# Patient Record
Sex: Male | Born: 1965 | Race: White | Hispanic: No | Marital: Single | Smoking: Current every day smoker
Health system: Southern US, Community
[De-identification: ages and names within clinical notes are randomized; demographics above are authoritative.]

---

## 2015-09-30 ENCOUNTER — Encounter: Payer: Self-pay | Admitting: *Deleted

## 2015-09-30 ENCOUNTER — Emergency Department: Payer: Self-pay

## 2015-09-30 ENCOUNTER — Emergency Department
Admission: EM | Admit: 2015-09-30 | Discharge: 2015-09-30 | Disposition: A | Discharge status: Home / Self Care | Payer: Self-pay | Attending: Emergency Medicine | Admitting: Emergency Medicine

## 2015-09-30 DIAGNOSIS — F17210 Nicotine dependence, cigarettes, uncomplicated: Secondary | ICD-10-CM | POA: Insufficient documentation

## 2015-09-30 DIAGNOSIS — Y9389 Activity, other specified: Secondary | ICD-10-CM | POA: Insufficient documentation

## 2015-09-30 DIAGNOSIS — S4382XA Sprain of other specified parts of left shoulder girdle, initial encounter: Secondary | ICD-10-CM | POA: Insufficient documentation

## 2015-09-30 DIAGNOSIS — Y998 Other external cause status: Secondary | ICD-10-CM | POA: Insufficient documentation

## 2015-09-30 DIAGNOSIS — S63501A Unspecified sprain of right wrist, initial encounter: Secondary | ICD-10-CM | POA: Insufficient documentation

## 2015-09-30 DIAGNOSIS — S46912A Strain of unspecified muscle, fascia and tendon at shoulder and upper arm level, left arm, initial encounter: Secondary | ICD-10-CM | POA: Insufficient documentation

## 2015-09-30 DIAGNOSIS — Y9289 Other specified places as the place of occurrence of the external cause: Secondary | ICD-10-CM | POA: Insufficient documentation

## 2015-09-30 DIAGNOSIS — R000 Tachycardia, unspecified: Secondary | ICD-10-CM | POA: Insufficient documentation

## 2015-09-30 DIAGNOSIS — W11XXXA Fall on and from ladder, initial encounter: Secondary | ICD-10-CM | POA: Insufficient documentation

## 2015-09-30 DIAGNOSIS — S43102A Unspecified dislocation of left acromioclavicular joint, initial encounter: Secondary | ICD-10-CM | POA: Insufficient documentation

## 2015-09-30 LAB — COMPREHENSIVE METABOLIC PANEL
ALBUMIN: 3.4 g/dL — AB (ref 3.5–5.0)
ALK PHOS: 114 U/L (ref 38–126)
ALT: 9 U/L — ABNORMAL LOW (ref 17–63)
ANION GAP: 8 (ref 5–15)
AST: 15 U/L (ref 15–41)
BILIRUBIN TOTAL: 0.4 mg/dL (ref 0.3–1.2)
BUN: 9 mg/dL (ref 6–20)
CO2: 22 mmol/L (ref 22–32)
Calcium: 8.4 mg/dL — ABNORMAL LOW (ref 8.9–10.3)
Chloride: 104 mmol/L (ref 101–111)
Creatinine, Ser: 0.95 mg/dL (ref 0.61–1.24)
GFR calc Af Amer: >60 mL/min (ref >60)
GFR calc non Af Amer: >60 mL/min (ref >60)
GLUCOSE: 87 mg/dL (ref 65–99)
POTASSIUM: 4.2 mmol/L (ref 3.5–5.1)
SODIUM: 134 mmol/L — AB (ref 135–145)
TOTAL PROTEIN: 6.9 g/dL (ref 6.5–8.1)

## 2015-09-30 LAB — CBC
HCT: 29 % — ABNORMAL LOW (ref 40.0–52.0)
HEMOGLOBIN: 9.1 g/dL — AB (ref 13.0–18.0)
MCH: 22.2 pg — AB (ref 26.0–34.0)
MCHC: 31.4 g/dL — AB (ref 32.0–36.0)
MCV: 70.7 fL — AB (ref 80.0–100.0)
Platelets: 120 10*3/uL — ABNORMAL LOW (ref 150–440)
RBC: 4.1 MIL/uL — AB (ref 4.40–5.90)
RDW: 34.1 % — ABNORMAL HIGH (ref 11.5–14.5)
WBC: 7.8 10*3/uL (ref 3.8–10.6)

## 2015-09-30 LAB — TROPONIN I: Troponin I: <0.03 ng/mL (ref <0.031)

## 2015-09-30 MED ORDER — NAPROXEN 500 MG PO TABS: 500.0000 mg | ORAL_TABLET | Freq: Two times a day (BID) | ORAL | Status: AC

## 2015-09-30 MED ORDER — HYDROCODONE-ACETAMINOPHEN 5-325 MG PO TABS: 1.0000 | ORAL_TABLET | ORAL | Status: DC | PRN

## 2015-09-30 MED ORDER — MORPHINE SULFATE (PF) 4 MG/ML IV SOLN
4.0000 mg | Freq: Once | INTRAVENOUS | Status: AC
  Administered 2015-09-30: 4 mg via INTRAVENOUS
  Filled 2015-09-30: qty 1

## 2015-09-30 MED ORDER — OXYCODONE-ACETAMINOPHEN 5-325 MG PO TABS: 1.0000 | ORAL_TABLET | Freq: Four times a day (QID) | ORAL | Status: AC | PRN

## 2015-09-30 MED ORDER — ONDANSETRON HCL 4 MG/2ML IJ SOLN
4.0000 mg | Freq: Once | INTRAMUSCULAR | Status: AC
  Administered 2015-09-30: 4 mg via INTRAVENOUS
  Filled 2015-09-30: qty 2

## 2015-09-30 NOTE — Discharge Instructions (Signed)
Shoulder Separation  A shoulder separation (acromioclavicular separation) is an injury to the connecting tissue (ligament) between the top of your shoulder blade (acromion) and your collarbone (clavicle).  The ligament may be stretched, partially torn, or completely torn.   A stretched ligament may not cause very much pain, and it does not move the collarbone out of place. A stretched ligament looks normal on an X-ray.   An injury that is a bit worse may partially tear a ligament and move the collarbone slightly out of place.   A serious injury completely tears both shoulder ligaments. This moves the collarbone severely out of position and changes the way that the shoulder looks (deformity).  CAUSES  The most common cause of a shoulder separation is falling on or receiving a blow to the top of the shoulder. Falling with an outstretched arm may also cause this injury.  RISK FACTORS  You may be at greater risk of a shoulder separation if:   You are male.   You are younger than age 35.   You play a contact sport, such as football or hockey.  SIGNS AND SYMPTOMS  The most common symptom of a shoulder separation is pain on the top of the shoulder after falling on it or receiving a blow to it. Other signs and symptoms include:   Shoulder deformity.   Swelling of the shoulder.   Decreased ability to move the shoulder.   Bruising on top of the shoulder.  DIAGNOSIS  Your health care provider may suspect a shoulder separation based on your symptoms and the details of a recent injury. A physical exam will be done. During this exam, the health care provider may:   Press on your shoulder.   Test the movement of your shoulder.   Ask you to hold a weight in your hand to see if the separation increases.   Do an X-ray.  TREATMENT   A stretch injury may require only a sling, pain medicine, and cold packs. This treatment may last for 2-12 weeks. You may also have physical therapy. A physical therapist will teach you to  do daily exercises to strengthen your shoulder muscles and prevent stiffness.   A complete tear may require surgery to repair the torn ligament. After surgery, you will also require a sling, pain medicine, and cold packs. Recovery may take longer. You may also need more physical therapy.  HOME CARE INSTRUCTIONS   Take medicines only as directed by your health care provider.   Apply ice to the top of your shoulder:   Put ice in a plastic bag.   Place a towel between your skin and the bag.   Leave the ice on for 20 minutes, 2-3 times a day.   Wear your sling or splint as directed by your health care provider.   You may be able to remove your sling to do your physical therapy exercises.   Ask your health care provider when you can stop wearing the sling.   Do not do any activities that make your pain worse.   Do not lift anything that is heavier than 10 lb (4.5 kg) on the injured side of your body.   Ask your health care provider when you can return to athletic activities.  SEEK MEDICAL CARE IF:   Your pain medicine is not relieving your pain.   Your pain and stiffness are not improving after 2 weeks.   You are unable to do your physical therapy exercises because   of pain or stiffness.     This information is not intended to replace advice given to you by your health care provider. Make sure you discuss any questions you have with your health care provider.     Document Released: 05/30/2005 Document Revised: 09/10/2014 Document Reviewed: 01/20/2014  [employer] Interactive Patient Education 2016 [employer]

## 2015-09-30 NOTE — ED Notes (Signed)
Pt was on a ladder cleaning gutters, pt fell landed on arms, bilateral arms are swollen and painful, positive pulses, pt denies hitting head, pt refuses C-collar to be placed on by RN

## 2015-09-30 NOTE — ED Notes (Signed)
Pt states he has a ride with family.

## 2015-09-30 NOTE — ED Provider Notes (Signed)
[facility] Emergency Department Provider Note  ____________________________________________  Time seen: On arrival  I have reviewed the triage vital signs and the nursing notes.   HISTORY  Chief Complaint Fall and Arm Injury    HPI Ronald Boyle is a 49 y.o. male who presents after falling from a ladder. He reports he was cleaning gutters and the ladder went to the right and he fell to the left. He landed on his hands and arms and left chest. He landed on grass. He estimates he fell about 10 feet. He denies abdominal pain. He denies chest pain. He complains of left shoulder pain, right wrist pain. He denies leg pain or pelvic pain. No nausea or vomiting. No shortness of breath.     History reviewed. No pertinent past medical history.  There are no active problems to display for this patient.   History reviewed. No pertinent past surgical history.  No current outpatient prescriptions on file.  Allergies Review of patient's allergies indicates no known allergies.  No family history on file.  Social History Social History  Substance Use Topics  . Smoking status: Current Every Day Smoker -- 1.00 packs/day    Types: Cigarettes  . Smokeless tobacco: None  . Alcohol Use: No    Review of Systems  Constitutional: Negative for dizziness Eyes: Negative for visual changes. ENT: Negative for sore throat, negative for neck pain Cardiovascular: Negative for chest pain. Respiratory: Negative for shortness of breath.  Gastrointestinal: Negative for abdominal pain, vomiting and diarrhea. Genitourinary: Negative for dysuria. Musculoskeletal: Negative for back pain. Positive for left shoulder pain and right wrist pain Skin: Negative for laceration or abrasion Neurological: Negative for headaches or focal weakness Psychiatric: Positive anxiety    ____________________________________________   PHYSICAL EXAM:  VITAL SIGNS: ED Triage Vitals  Enc  Vitals Group     BP 09/30/15 1409 128/89 mmHg     Pulse Rate 09/30/15 1409 112     Resp 09/30/15 1409 22     Temp 09/30/15 1409 98.6 F (37 C)     Temp Source 09/30/15 1409 Oral     SpO2 09/30/15 1409 96 %     Weight 09/30/15 1409 280 lb (127.007 kg)     Height 09/30/15 1409  (1.88 m)     Head Cir --      Peak Flow --      Pain Score 09/30/15 1402 9     Pain Loc --      Pain Edu? --      Excl. in GC? --      Constitutional: Alert and oriented. Well appearing but in pain Eyes: Conjunctivae are normal.  ENT   Head: Normocephalic and atraumatic.   Mouth/Throat: Mucous membranes are moist. Cardiovascular: Tachycardia, regular rhythm. Normal and symmetric distal pulses are present in all extremities. No murmurs, rubs, or gallops. Respiratory: Normal respiratory effort without tachypnea nor retractions. Breath sounds are clear and equal bilaterally.  Gastrointestinal: Soft and non-tender in all quadrants. No distention. There is no CVA tenderness. Genitourinary: deferred Musculoskeletal: Painful range of motion of the left shoulder especially with abduction. No bony abnormalities felt. No tenderness of the clavicle bilaterally. No chest wall tenderness to palpation .No pelvic tenderness to palpation, full range of motion of lower extremities without discomfort. No lower extremity edema. Neurologic:  Normal speech and language. No gross focal neurologic deficits are appreciated. Skin:  Skin is warm, dry and intact. No rash noted. Psychiatric: Mood and affect are normal.  Patient exhibits appropriate insight and judgment.  ____________________________________________    LABS (pertinent positives/negatives)  Labs Reviewed  COMPREHENSIVE METABOLIC PANEL  TROPONIN I  CBC    ____________________________________________   EKG  None  ____________________________________________    RADIOLOGY I have personally reviewed any xrays that were ordered on this patient: no  pneumothorax on chest x-ray Right wrist negative for fracture Left shoulder with possible AC injury  ____________________________________________   PROCEDURES  Procedure(s) performed: none  Critical Care performed: none  ____________________________________________   INITIAL IMPRESSION / ASSESSMENT AND PLAN / ED COURSE  Pertinent labs & imaging results that were available during my care of the patient were reviewed by me and considered in my medical decision making (see chart for details).  Patient presents after significant fall from ladder. No shortness of breath, no chest wall pain, no back pain or neck pain, no head injury. Pain is primarily in the left shoulder and right wrist. 4 g of morphine and 4 mg of Zofran given IV  X-rays are reassuring except for likely acromioclavicular and corococlavicular separation  We will provide the patient with analgesics and he will follow up with orthopedics  ____________________________________________   FINAL CLINICAL IMPRESSION(S) / ED DIAGNOSES  Final diagnoses:  AC separation, left, initial encounter  Coracoclavicular (ligament) sprain and strain, left, initial encounter  Wrist sprain, right, initial encounter     Jene Every, MD 09/30/15 2213

## 2016-10-13 IMAGING — CR DG HAND COMPLETE 3+V*R*
1 series · 3 of 3 positions shown · non-contrast
Comparison: None.

CLINICAL DATA: Status post fall.

EXAM:
RIGHT HAND - COMPLETE 3+ VIEW

[Series 1: pa · 0.17mm/px · 3 of 3 slices shown]
[im 1/3]
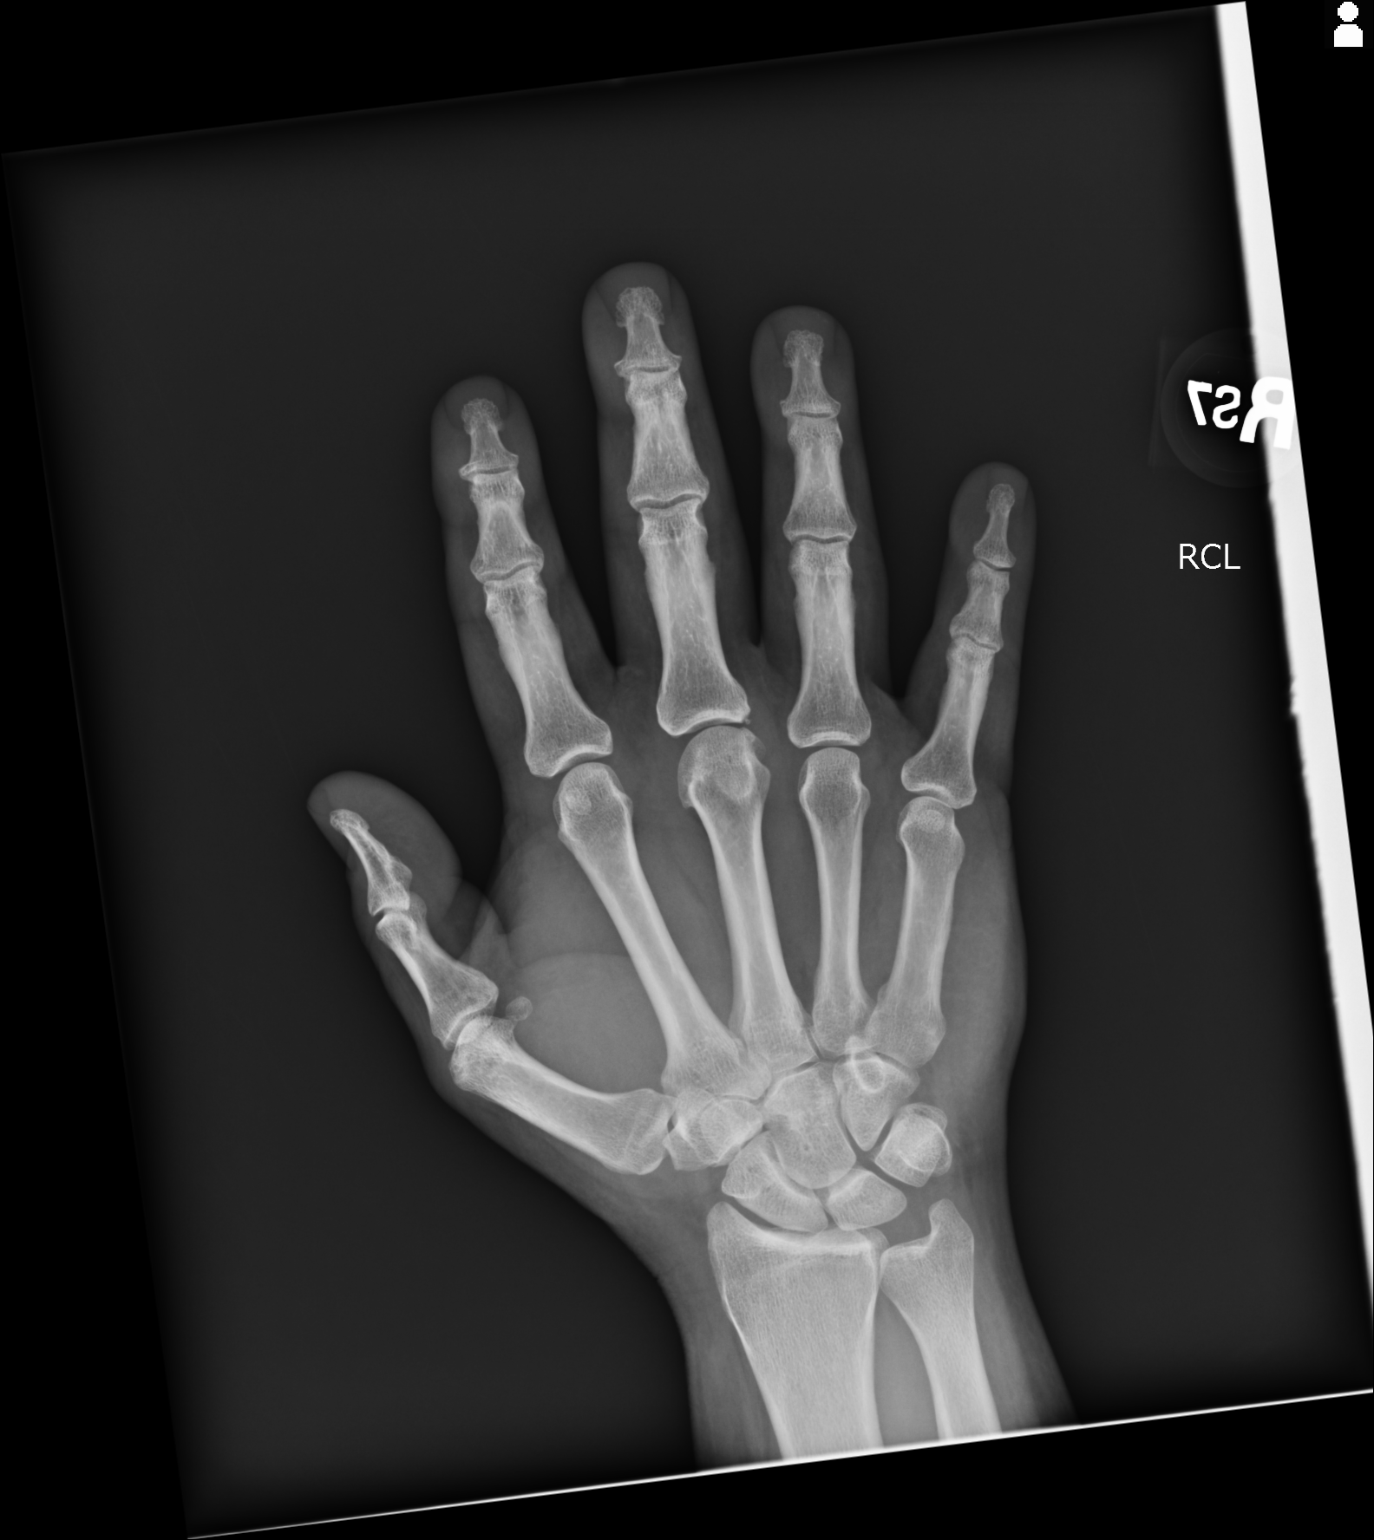
[im 2/3]
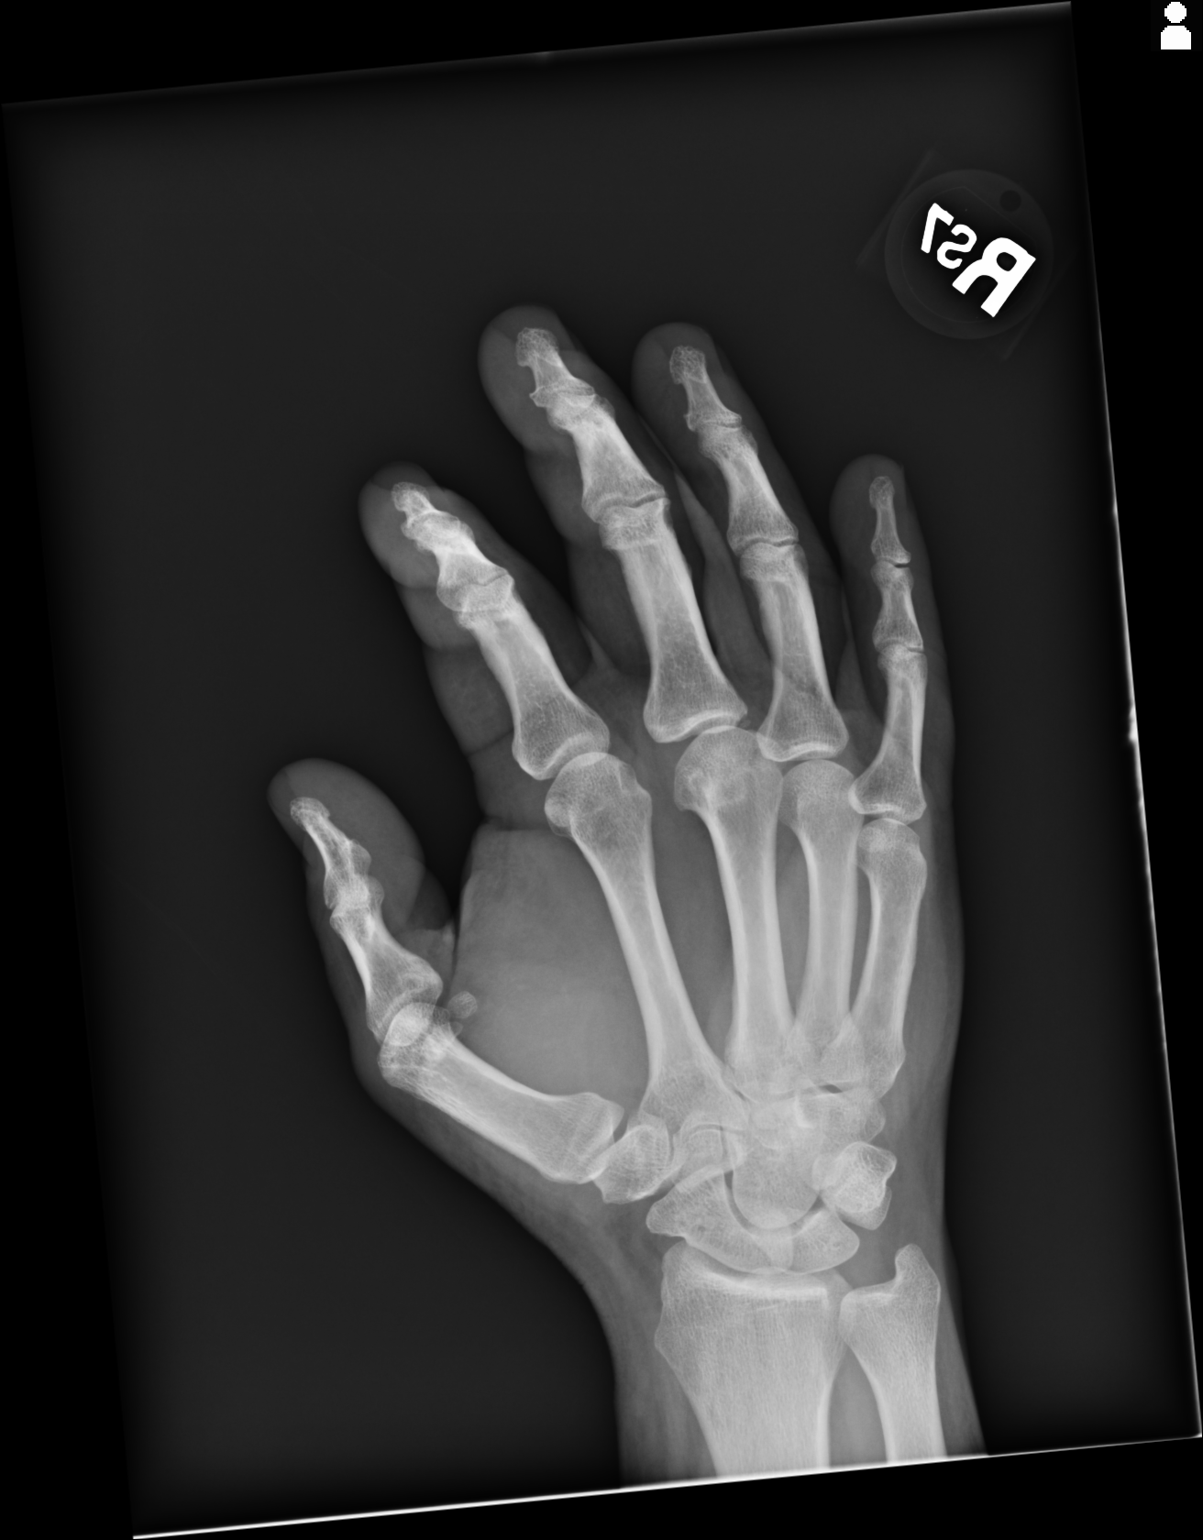
[im 3/3]
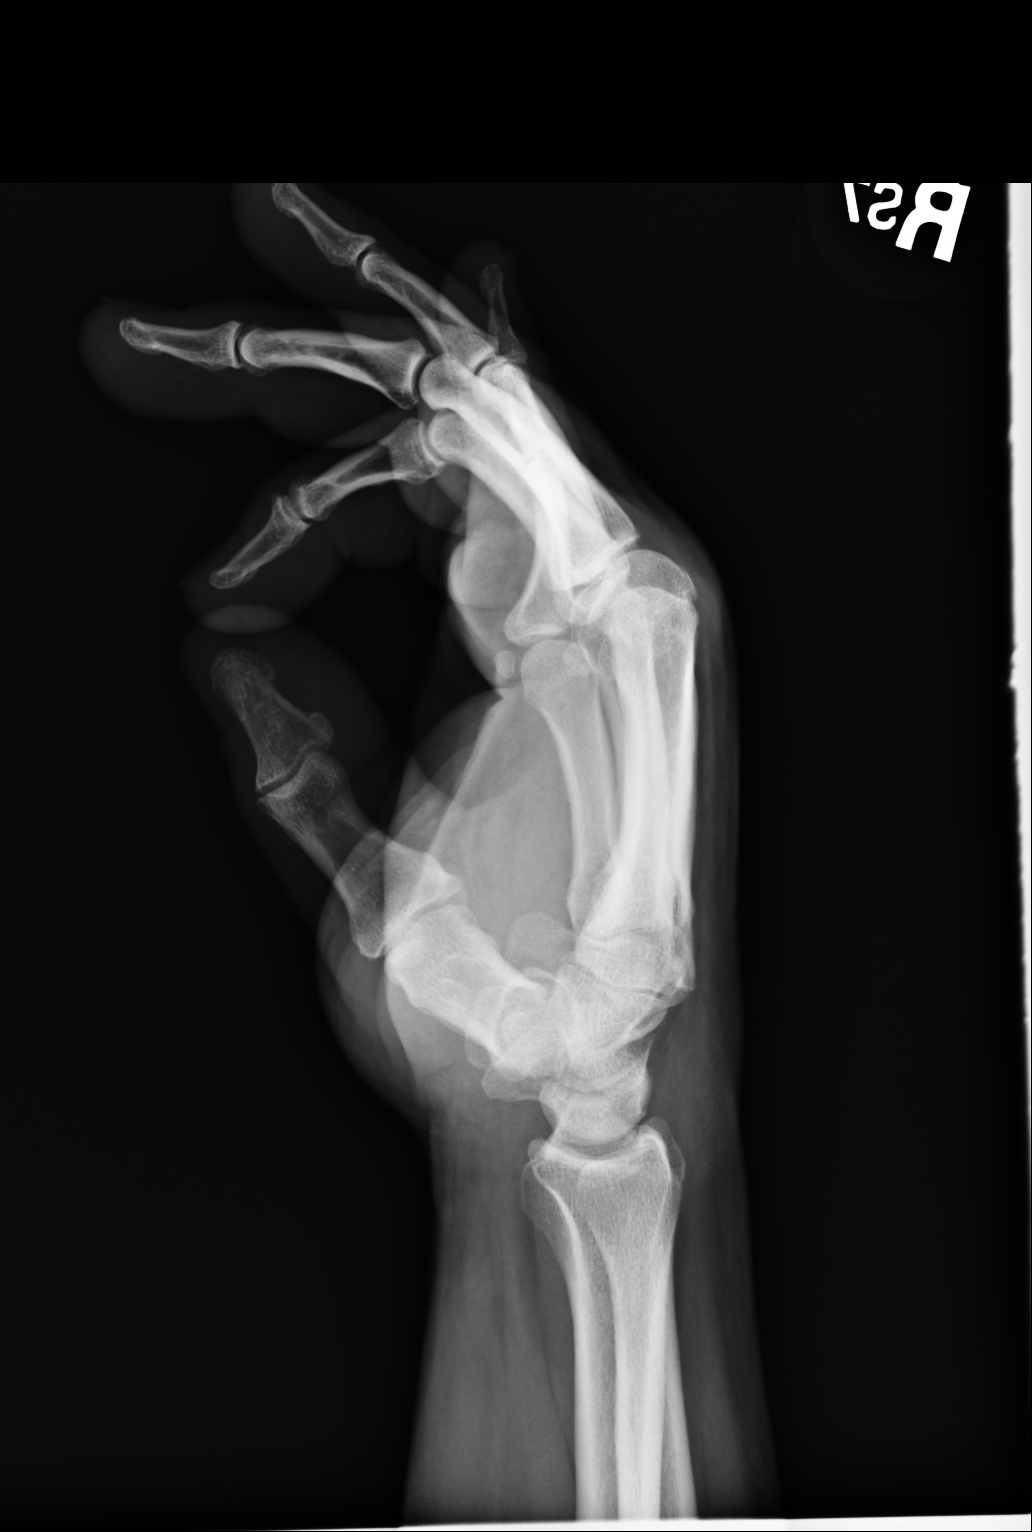

[3 of 3 positions shown; findings below may reference images not displayed]

FINDINGS: There is no evidence of fracture or dislocation. There is mild
osteoarthritis of the third MCP joint. Soft tissues are
unremarkable.
IMPRESSION: No acute osseous injury of the right hand.

## 2016-10-13 IMAGING — CR DG CHEST 1V PORT
1 series · 1 of 1 positions shown · non-contrast
Comparison: None.

CLINICAL DATA: Fall from ladder today with chest and shoulder pain,
initial encounter

EXAM:
PORTABLE CHEST 1 VIEW

[ap]
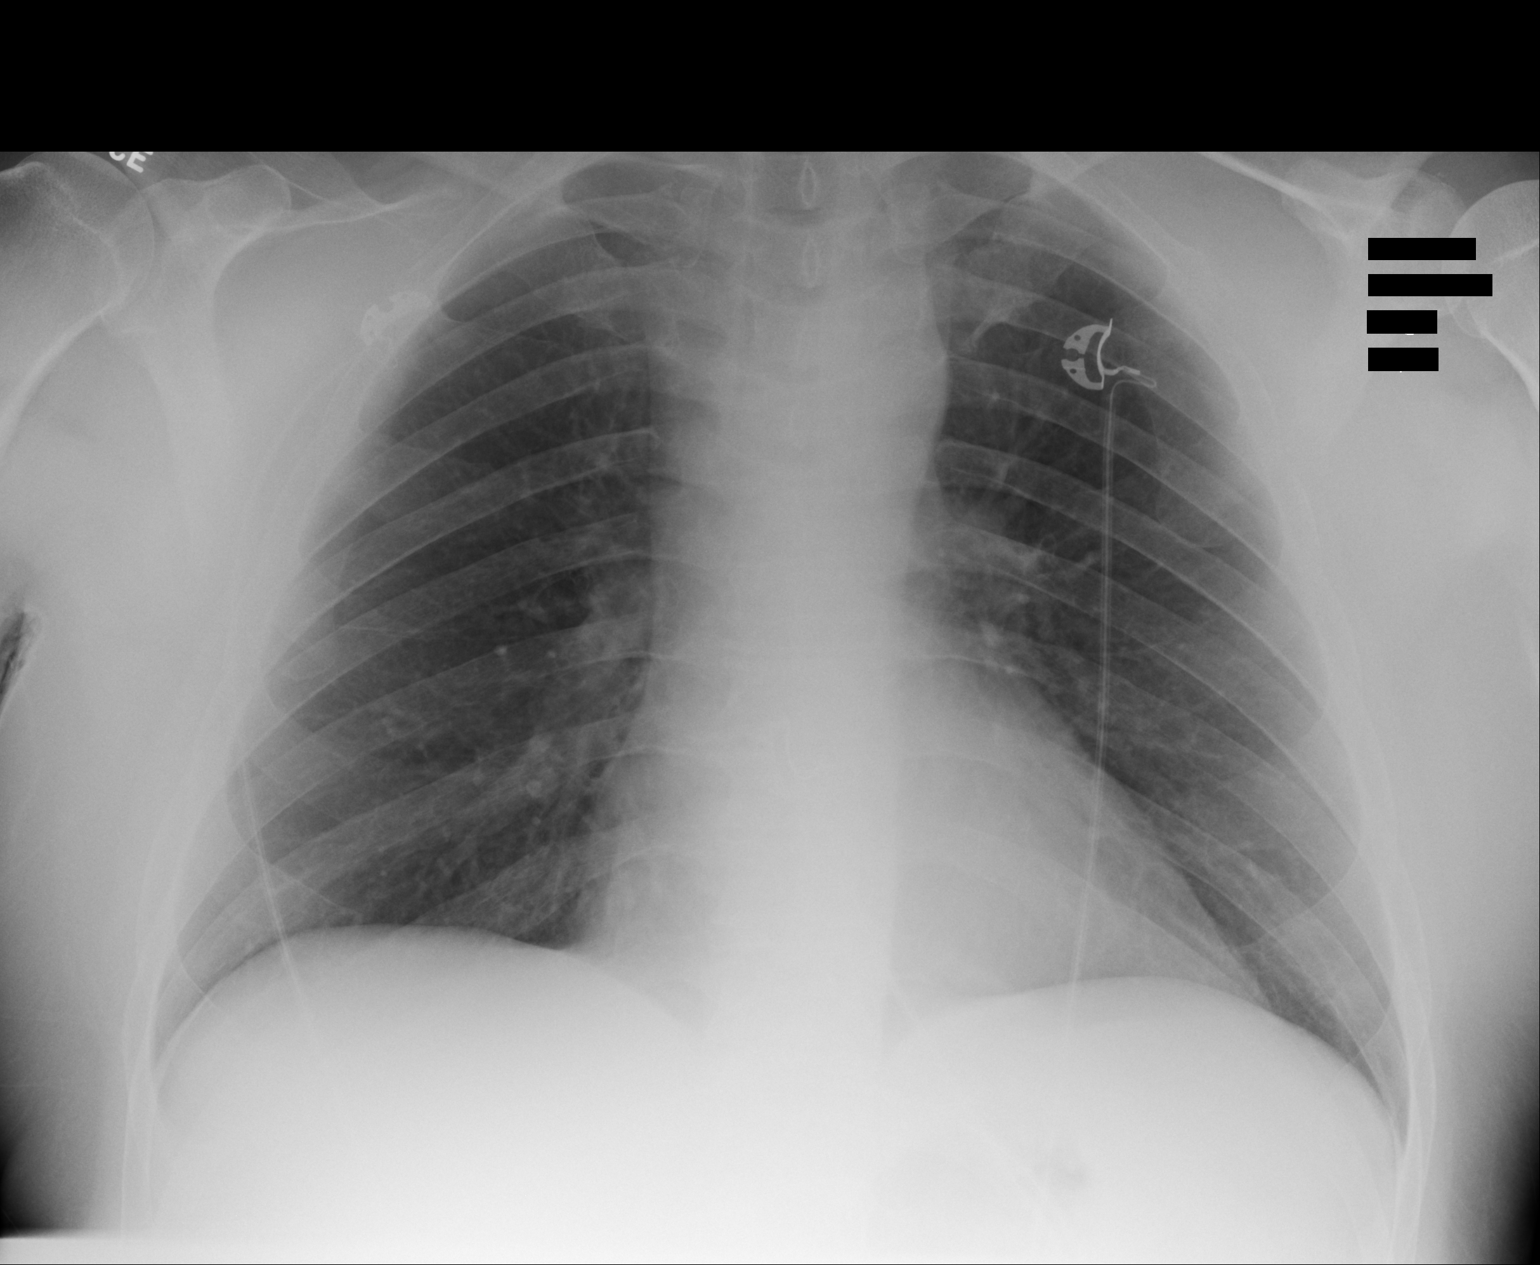

[1 of 1 positions shown; findings below may reference images not displayed]

FINDINGS: Cardiac shadow is within normal limits. The lungs are well aerated
bilaterally. No pneumothorax is noted. No acute fractures noted.
There is widening of the left acromioclavicular joint which is
likely related to the recent injury.
IMPRESSION: Widening of the left acromioclavicular joint likely related to the
recent injury. No other focal abnormality is noted.

## 2016-10-13 IMAGING — CR DG SHOULDER 2+V*L*
1 series · 3 of 3 positions shown · non-contrast
Comparison: None.

CLINICAL DATA: Patient fell off ladder

EXAM:
LEFT SHOULDER - 2+ VIEW

[Series 1: ap · 0.17mm/px · 3 of 3 slices shown]
[im 1/3]
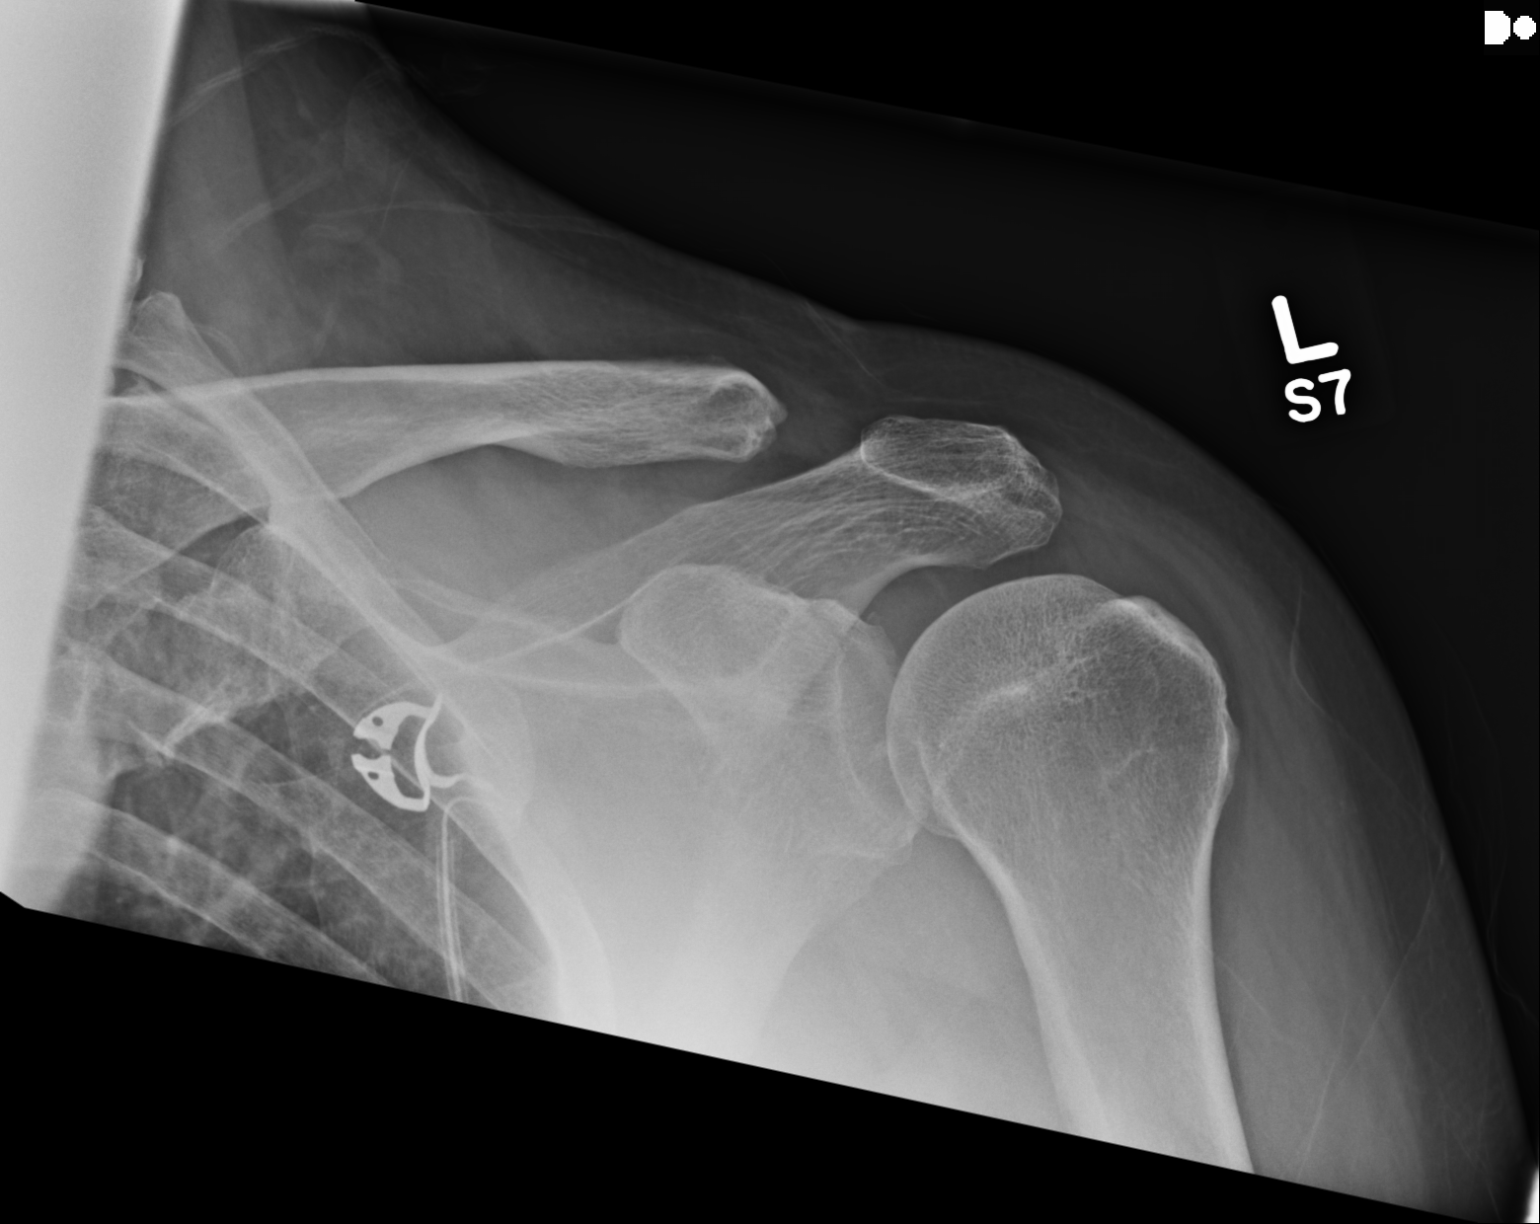
[im 2/3]
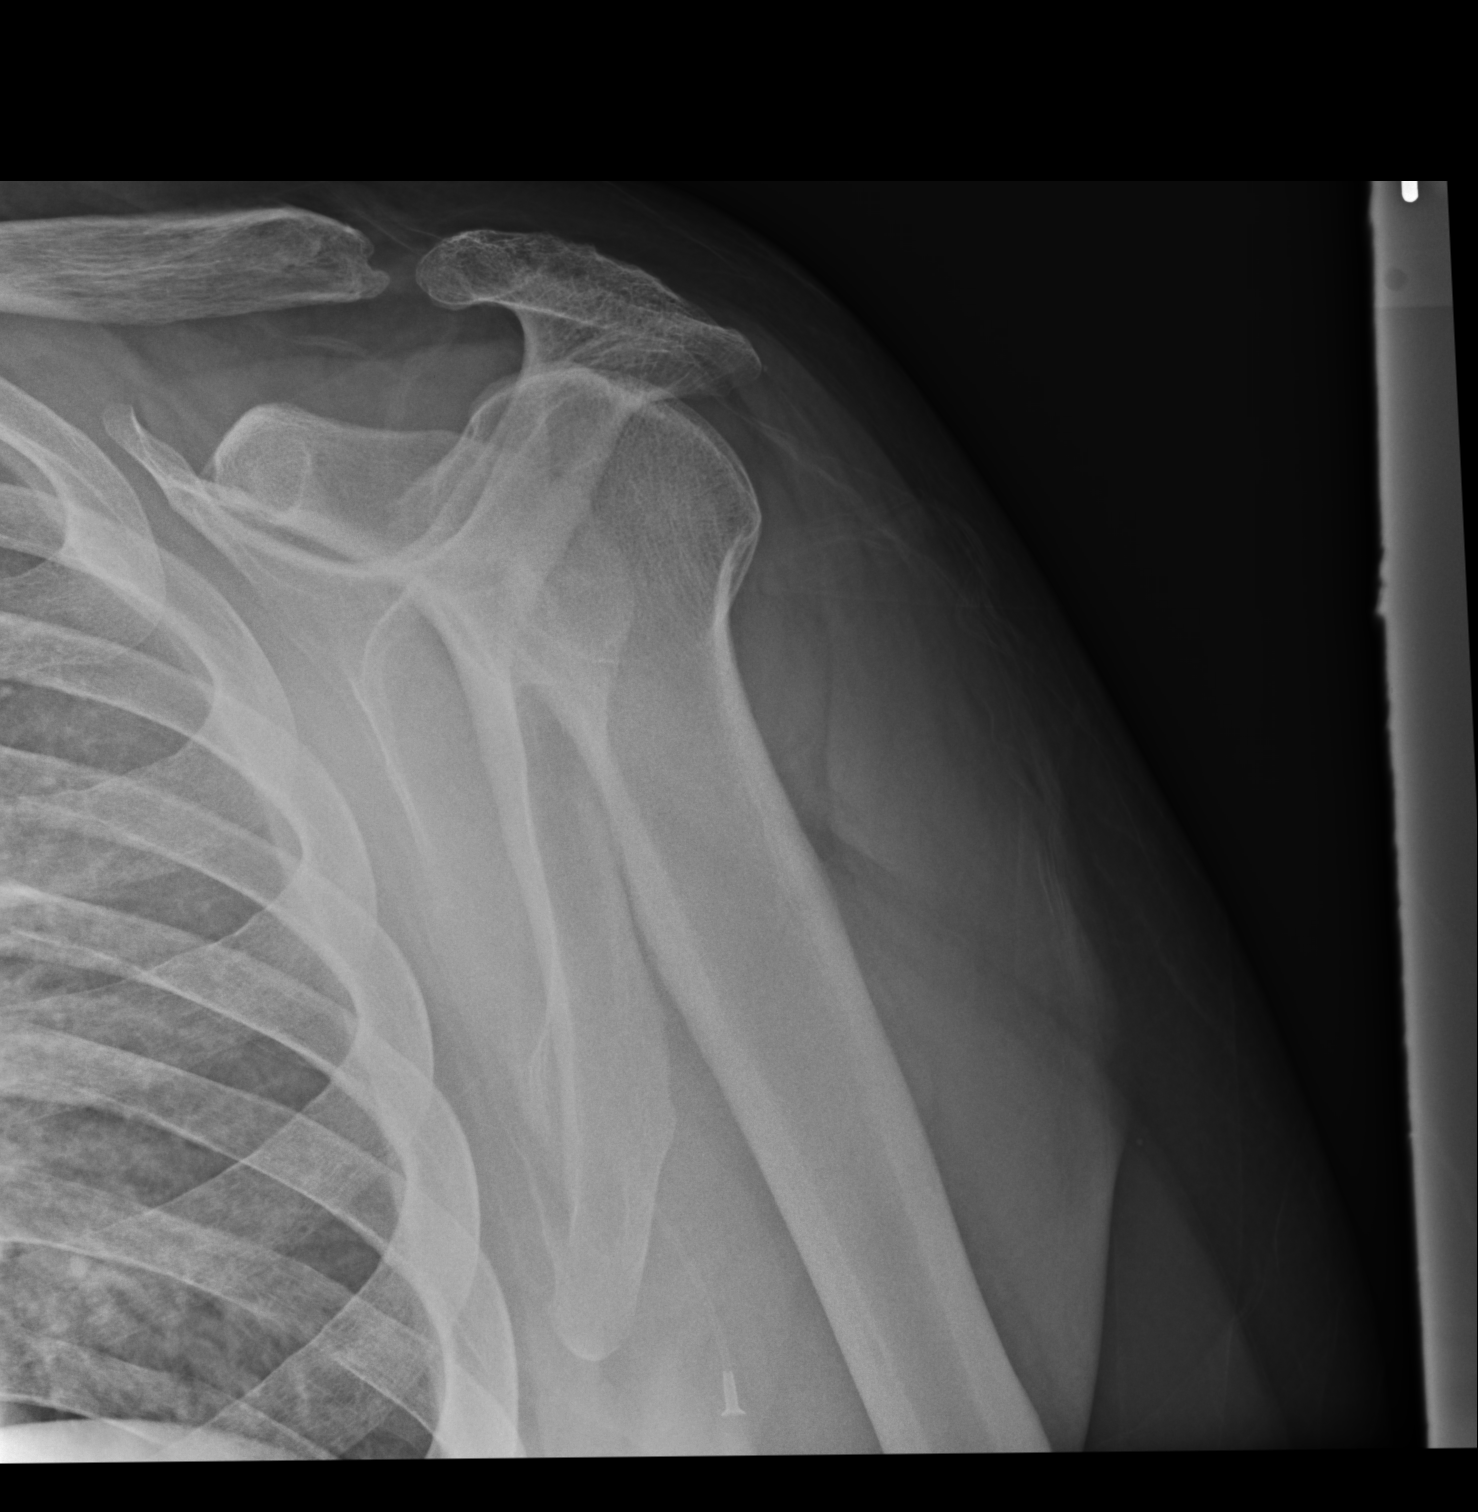
[im 3/3]
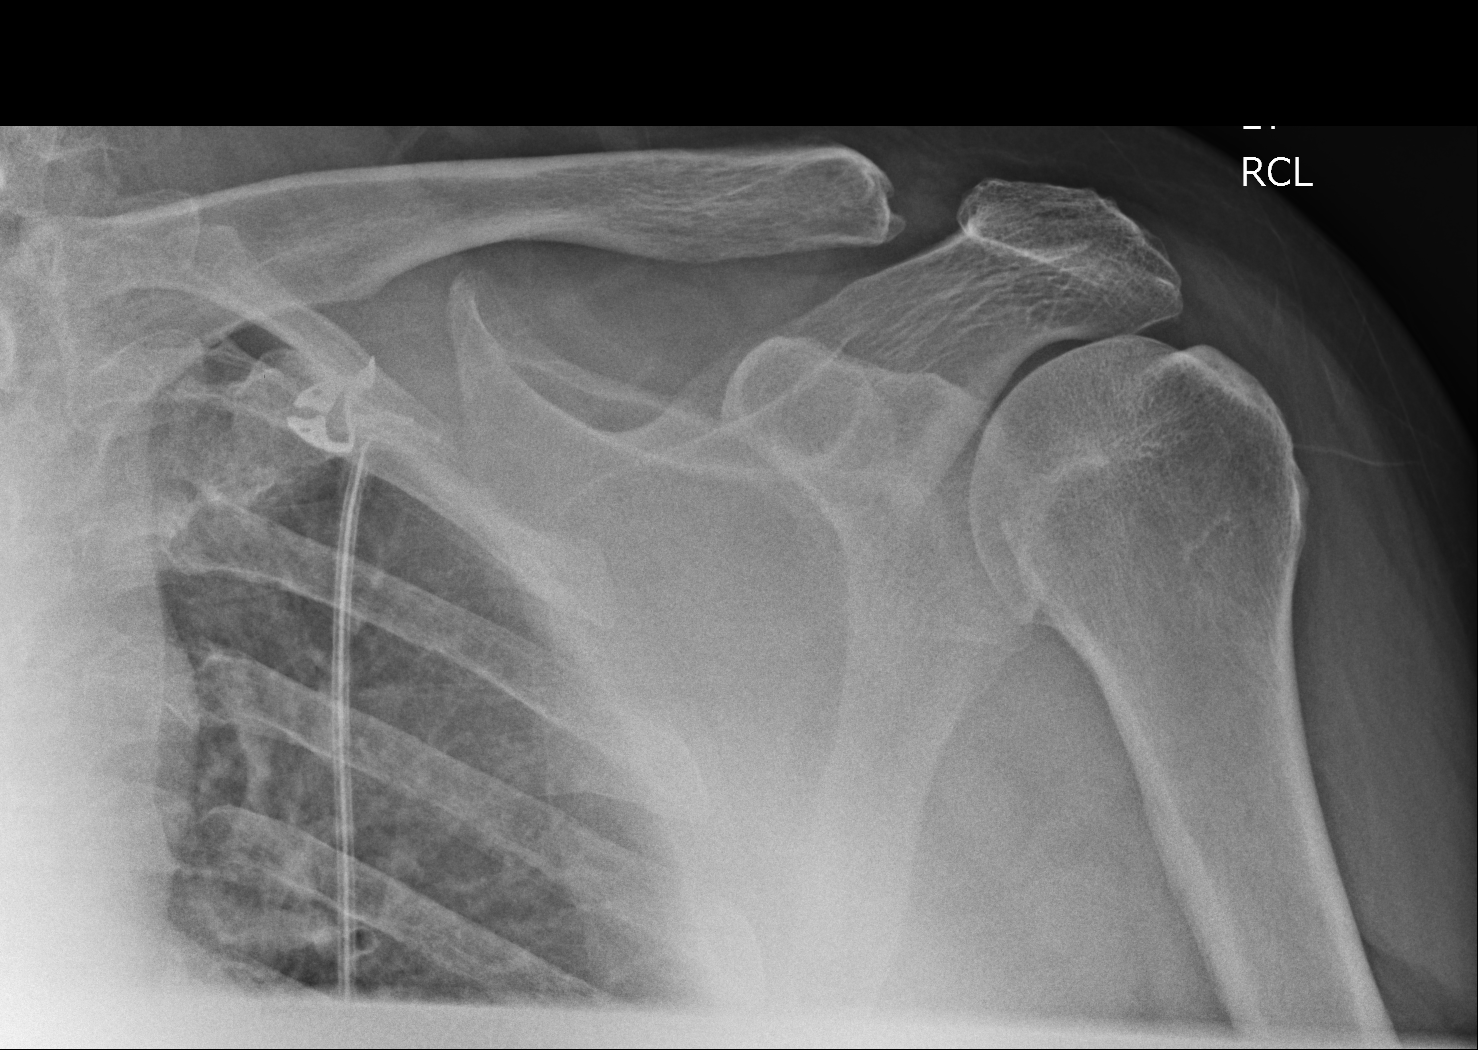

[3 of 3 positions shown; findings below may reference images not displayed]

FINDINGS: Internal rotation, external rotation, and Y scapular images were
obtained were obtained. There is apparent acromioclavicular and
coracoclavicular separation. There is no frank dislocation. There is
no demonstrable fracture. There is mild narrowing of the
glenohumeral joint.
IMPRESSION: Evidence suggesting acromioclavicular and coracoclavicular
separation. No fracture or frank dislocation. Mild narrowing
glenohumeral joint.
